# Patient Record
Sex: Female | Born: 1935 | Race: White | Hispanic: No | Marital: Married | Smoking: Never smoker
Health system: Southern US, Community
[De-identification: ages and names within clinical notes are randomized; demographics above are authoritative.]

## PROBLEM LIST (undated history)

## (undated) DIAGNOSIS — I10 Essential (primary) hypertension: Secondary | ICD-10-CM

## (undated) DIAGNOSIS — I2510 Atherosclerotic heart disease of native coronary artery without angina pectoris: Secondary | ICD-10-CM

## (undated) DIAGNOSIS — F419 Anxiety disorder, unspecified: Secondary | ICD-10-CM

## (undated) DIAGNOSIS — E079 Disorder of thyroid, unspecified: Secondary | ICD-10-CM

## (undated) DIAGNOSIS — I4891 Unspecified atrial fibrillation: Secondary | ICD-10-CM

## (undated) HISTORY — PX: PACEMAKER INSERTION: SHX728

---

## 2008-01-26 ENCOUNTER — Emergency Department (HOSPITAL_BASED_OUTPATIENT_CLINIC_OR_DEPARTMENT_OTHER): Admission: EM | Admit: 2008-01-26 | Discharge: 2008-01-26 | Payer: Self-pay | Admitting: Emergency Medicine

## 2008-02-03 ENCOUNTER — Emergency Department (HOSPITAL_BASED_OUTPATIENT_CLINIC_OR_DEPARTMENT_OTHER): Admission: EM | Admit: 2008-02-03 | Discharge: 2008-02-03 | Payer: Self-pay | Admitting: Emergency Medicine

## 2013-05-21 ENCOUNTER — Encounter (HOSPITAL_BASED_OUTPATIENT_CLINIC_OR_DEPARTMENT_OTHER): Payer: Self-pay | Admitting: Emergency Medicine

## 2013-05-21 ENCOUNTER — Emergency Department (HOSPITAL_BASED_OUTPATIENT_CLINIC_OR_DEPARTMENT_OTHER)
Admission: EM | Admit: 2013-05-21 | Discharge: 2013-05-21 | Disposition: A | Discharge status: Home / Self Care | Payer: Medicare Other | Attending: Emergency Medicine | Admitting: Emergency Medicine

## 2013-05-21 ENCOUNTER — Emergency Department (HOSPITAL_BASED_OUTPATIENT_CLINIC_OR_DEPARTMENT_OTHER): Payer: Medicare Other

## 2013-05-21 DIAGNOSIS — IMO0001 Reserved for inherently not codable concepts without codable children: Secondary | ICD-10-CM | POA: Insufficient documentation

## 2013-05-21 DIAGNOSIS — Y93K1 Activity, walking an animal: Secondary | ICD-10-CM | POA: Insufficient documentation

## 2013-05-21 DIAGNOSIS — I2510 Atherosclerotic heart disease of native coronary artery without angina pectoris: Secondary | ICD-10-CM | POA: Insufficient documentation

## 2013-05-21 DIAGNOSIS — Z950 Presence of cardiac pacemaker: Secondary | ICD-10-CM | POA: Insufficient documentation

## 2013-05-21 DIAGNOSIS — I10 Essential (primary) hypertension: Secondary | ICD-10-CM | POA: Insufficient documentation

## 2013-05-21 DIAGNOSIS — W010XXA Fall on same level from slipping, tripping and stumbling without subsequent striking against object, initial encounter: Secondary | ICD-10-CM | POA: Insufficient documentation

## 2013-05-21 DIAGNOSIS — I4891 Unspecified atrial fibrillation: Secondary | ICD-10-CM | POA: Insufficient documentation

## 2013-05-21 DIAGNOSIS — S82141A Displaced bicondylar fracture of right tibia, initial encounter for closed fracture: Secondary | ICD-10-CM

## 2013-05-21 DIAGNOSIS — F411 Generalized anxiety disorder: Secondary | ICD-10-CM | POA: Insufficient documentation

## 2013-05-21 DIAGNOSIS — S82109A Unspecified fracture of upper end of unspecified tibia, initial encounter for closed fracture: Secondary | ICD-10-CM | POA: Insufficient documentation

## 2013-05-21 DIAGNOSIS — E079 Disorder of thyroid, unspecified: Secondary | ICD-10-CM | POA: Insufficient documentation

## 2013-05-21 DIAGNOSIS — Y929 Unspecified place or not applicable: Secondary | ICD-10-CM | POA: Insufficient documentation

## 2013-05-21 HISTORY — DX: Disorder of thyroid, unspecified: E07.9

## 2013-05-21 HISTORY — DX: Atherosclerotic heart disease of native coronary artery without angina pectoris: I25.10

## 2013-05-21 HISTORY — DX: Essential (primary) hypertension: I10

## 2013-05-21 HISTORY — DX: Anxiety disorder, unspecified: F41.9

## 2013-05-21 HISTORY — DX: Unspecified atrial fibrillation: I48.91

## 2013-05-21 LAB — PROTIME-INR
INR: 2.5 — AB (ref 0.00–1.49)
PROTHROMBIN TIME: 26.2 s — AB (ref 11.6–15.2)

## 2013-05-21 MED ORDER — HYDROCODONE-ACETAMINOPHEN 5-325 MG PO TABS: 2.0000 | ORAL_TABLET | ORAL | Status: AC | PRN

## 2013-05-21 NOTE — ED Provider Notes (Signed)
CSN: [id number]     Arrival date & time 05/21/13  1110 History   First MD Initiated Contact with Patient 05/21/13 1133     Chief Complaint  Patient presents with  . Fall  . Knee Pain     (Consider location/radiation/quality/duration/timing/severity/associated sxs/prior Treatment) HPI Comments: Patient complains of right knee pain after falling 2 days ago. She was walking her dogs and stumbled and in the mud landing on her right knee. She denies hitting her head or losing consciousness. She is unable to put weight on the right leg. She denies any weakness, numbness or tingling. She denies any chest pain or shortness of breath. She denies any back pain. She is on Coumadin for atrial fibrillation. She denies any dizziness, lightheadedness or syncope.  The history is provided by the patient.    Past Medical History  Diagnosis Date  . Hypertension   . Coronary artery disease   . Thyroid disease   . Atrial fibrillation   . Anxiety    Past Surgical History  Procedure Laterality Date  . Pacemaker insertion     No family history on file. History  Substance Use Topics  . Smoking status: Never Smoker   . Smokeless tobacco: Not on file  . Alcohol Use: No   OB History   Grav Para Term Preterm Abortions TAB SAB Ect Mult Living                 Review of Systems  Constitutional: Negative for fever, activity change and appetite change.  HENT: Negative for congestion and rhinorrhea.   Respiratory: Negative for cough, chest tightness and shortness of breath.   Cardiovascular: Negative for chest pain.  Gastrointestinal: Negative for nausea, vomiting and abdominal pain.  Genitourinary: Negative for dysuria and hematuria.  Musculoskeletal: Positive for arthralgias and myalgias. Negative for back pain.  Skin: Negative for rash.  Neurological: Negative for dizziness, weakness and headaches.  A complete 10 system review of systems was obtained and all systems are negative except as noted in  the HPI and PMH.      Allergies  Review of patient's allergies indicates no known allergies.  Home Medications   Current Outpatient Rx  Name  Route  Sig  Dispense  Refill  . AMIODARONE HCL PO   Oral   Take by mouth.         . BUSPIRONE HCL PO   Oral   Take by mouth.         . diazepam (VALIUM) 2 MG tablet   Oral   Take 2 mg by mouth at bedtime as needed for anxiety.         Marland Kitchen HYDROcodone-acetaminophen (NORCO/VICODIN) 5-325 MG per tablet   Oral   Take 2 tablets by mouth every 4 (four) hours as needed.   10 tablet   0   . Levothyroxine Sodium (LEVOTHROID PO)   Oral   Take by mouth.         Marland Kitchen UNKNOWN TO PATIENT      Blood pressure medication, does not know name         . WARFARIN SODIUM PO   Oral   Take by mouth.          BP 109/60  Pulse 62  Temp(Src) 98.6 F (37 C) (Oral)  Resp 20  Ht 5\' 3"  (1.6 m)  Wt 140 lb (63.504 kg)  BMI 24.81 kg/m2  SpO2 98% Physical Exam  Constitutional: She is oriented to person, place, and  time. She appears well-developed and well-nourished. No distress.  HENT:  Head: Normocephalic and atraumatic.  Mouth/Throat: Oropharynx is clear and moist. No oropharyngeal exudate.  Eyes: Conjunctivae and EOM are normal. Pupils are equal, round, and reactive to light.  Neck: Normal range of motion. Neck supple.  No C spine tenderness  Cardiovascular: Normal rate, regular rhythm and normal heart sounds.   No murmur heard. Pulmonary/Chest: Effort normal and breath sounds normal. No respiratory distress.  Abdominal: Soft. There is no tenderness. There is no rebound and no guarding.  Musculoskeletal: Normal range of motion. She exhibits no edema and no tenderness.  TTP R anterior knee. No effusion or deformity, flexion and extension intact. +2 DP and PT pulse. No pain with ROM R hip.  No T or L spine tenderness  Neurological: She is alert and oriented to person, place, and time. No cranial nerve deficit. She exhibits normal muscle  tone. Coordination normal.  Skin: Skin is warm.    ED Course  Procedures (including critical care time) Labs Review Labs Reviewed  PROTIME-INR - Abnormal; Notable for the following:    Prothrombin Time 26.2 (*)    INR 2.50 (*)    All other components within normal limits   Imaging Review Dg Hip Complete Right  05/21/2013   CLINICAL DATA:  Fall  EXAM: RIGHT HIP - COMPLETE 2+ VIEW  COMPARISON:  None.  FINDINGS: Osteopenia.  No acute fracture.  No dislocation.  IUD noted.  IMPRESSION: No acute bony pathology.   Electronically Signed   By: Maryclare BeanArt  Hoss M.D.   On: 05/21/2013 12:24   Ct Head Wo Contrast  05/21/2013   CLINICAL DATA:  Slipped and fell in mud.  Headache.  EXAM: CT HEAD WITHOUT CONTRAST  TECHNIQUE: Contiguous axial images were obtained from the base of the skull through the vertex without contrast.  COMPARISON:  01/26/2008  FINDINGS: There is chronic encephalomalacia in the medial and inferior left cerebellar hemisphere. No evidence for acute hemorrhage, mass lesion, midline shift, hydrocephalus or new infarct. Chronic calcification in the left basal ganglia. No acute bone abnormality. The visualized sinuses and mastoid air cells are clear.  IMPRESSION: No acute intracranial abnormality.  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   Electronically Signed   By: Richarda OverlieAdam  Henn M.D.   On: 05/21/2013 12:31   Dg Knee Complete 4 Views Right  05/21/2013   CLINICAL DATA:  Right knee pain after fall.  EXAM: RIGHT KNEE - COMPLETE 4+ VIEW  COMPARISON:  None.  FINDINGS: No joint effusion is noted. Mildly depressed fracture involving the medial tibial plateau is noted. No joint space narrowing is noted.  IMPRESSION: Mildly depressed medial tibial plateau fracture.   Electronically Signed   By: Roque LiasJames  Green M.D.   On: 05/21/2013 12:21     EKG Interpretation None      MDM   Final diagnoses:  Tibial plateau fracture, right   Right knee pain after falling  in the mud 2 days ago. Denies hitting head or losing consciousness.  Right knee x-ray shows a medial tibial plateau fracture. Hip x-ray negative. CT head negative. INR 2.5 Intact distal pulses.   Fracture discussed with Dr. Charlann Boxerlin of orthopedics. He recommends a knee wrap, nonweightbearing. Followup in 2 weeks. Patient has walker at home as well as wheechair.  Denies any pain with ROM of Hip.    Date: 05/21/2013  Rate: 65  Rhythm: normal sinus rhythm paced  QRS Axis: left  Intervals: normal  ST/T Wave abnormalities: normal  Conduction Disutrbances:none  Narrative Interpretation:   Old EKG Reviewed: none available    Glynn Octave, MD 05/21/13 1505

## 2013-05-21 NOTE — Discharge Instructions (Signed)
Tibial Fracture, Adult Do not bear weight on R leg.  Follow up with Dr. Charlann Boxerlin or orthopedic doctor of your choice in 2 weeks. Return to the ED if you develop new or worsening symptoms. You have a fracture (break in bone) of your tibia. This is the large "shin" bone in your lower leg. These fractures are easily diagnosed with x-rays. TREATMENT  You have a simple fracture which usually will heal without disability. It can be treated with simple immobilization. This means the bone can be held with a cast or splint in a favorable position until your caregiver feels it is stable (healed well enough). Then you can begin range of motion exercises to keep your knee and ankle limber (moving well). HOME CARE INSTRUCTIONS   Apply ice to the injury for 15-20 minutes, 03-04 times per day while awake, for 2 days. Put the ice in a plastic bag and place a thin towel between the bag of ice and your cast.  If you have a plaster or fiberglass cast:  Do not try to scratch the skin under the cast using sharp or pointed objects.  Check the skin around the cast every day. You may put lotion on any red or sore areas.  Keep your cast dry and clean.  If you have a plaster splint:  Wear the splint as directed.  You may loosen the elastic around the splint if your toes become numb, tingle, or turn cold or blue.  Do not put pressure on any part of your cast or splint until it is fully hardened.  Your cast or splint can be protected during bathing with a plastic bag. Do not lower the cast or splint into water.  Use crutches as directed.  Only take over-the-counter or prescription medicines for pain, discomfort, or fever as directed by your caregiver.  See your caregiver as directed. It is very important to keep all follow-up referrals and appointments in order to avoid any long-term problems with your leg and ankle including chronic pain, inability to move the ankle normally, failure of the fracture to heal and  permanent disability. SEEK IMMEDIATE MEDICAL CARE IF:   Pain is becoming worse rather than better, or if pain is uncontrolled with medications.  You have increased swelling, pain, or redness in the foot.  You begin to lose feeling in your foot or toes.  You develop a cold or blue foot or toes on the injured side.  You develop severe pain in your injured leg, especially if it is increased with movement of your toes. Document Released: 12/01/2000 Document Revised: 05/31/2011 Document Reviewed: 06/25/2008 [facility] Patient Information 2014 [place], [place].

## 2013-05-21 NOTE — ED Notes (Signed)
Slipped in mud and fell 2 days ago. Hit her right knee on unknown object. Painful.

## 2015-06-11 IMAGING — CR DG HIP COMPLETE 2+V*R*
3 series · 3 of 3 positions shown · non-contrast
Comparison: None.

CLINICAL DATA: Fall

EXAM:
RIGHT HIP - COMPLETE 2+ VIEW

[t pelvis a.p.]
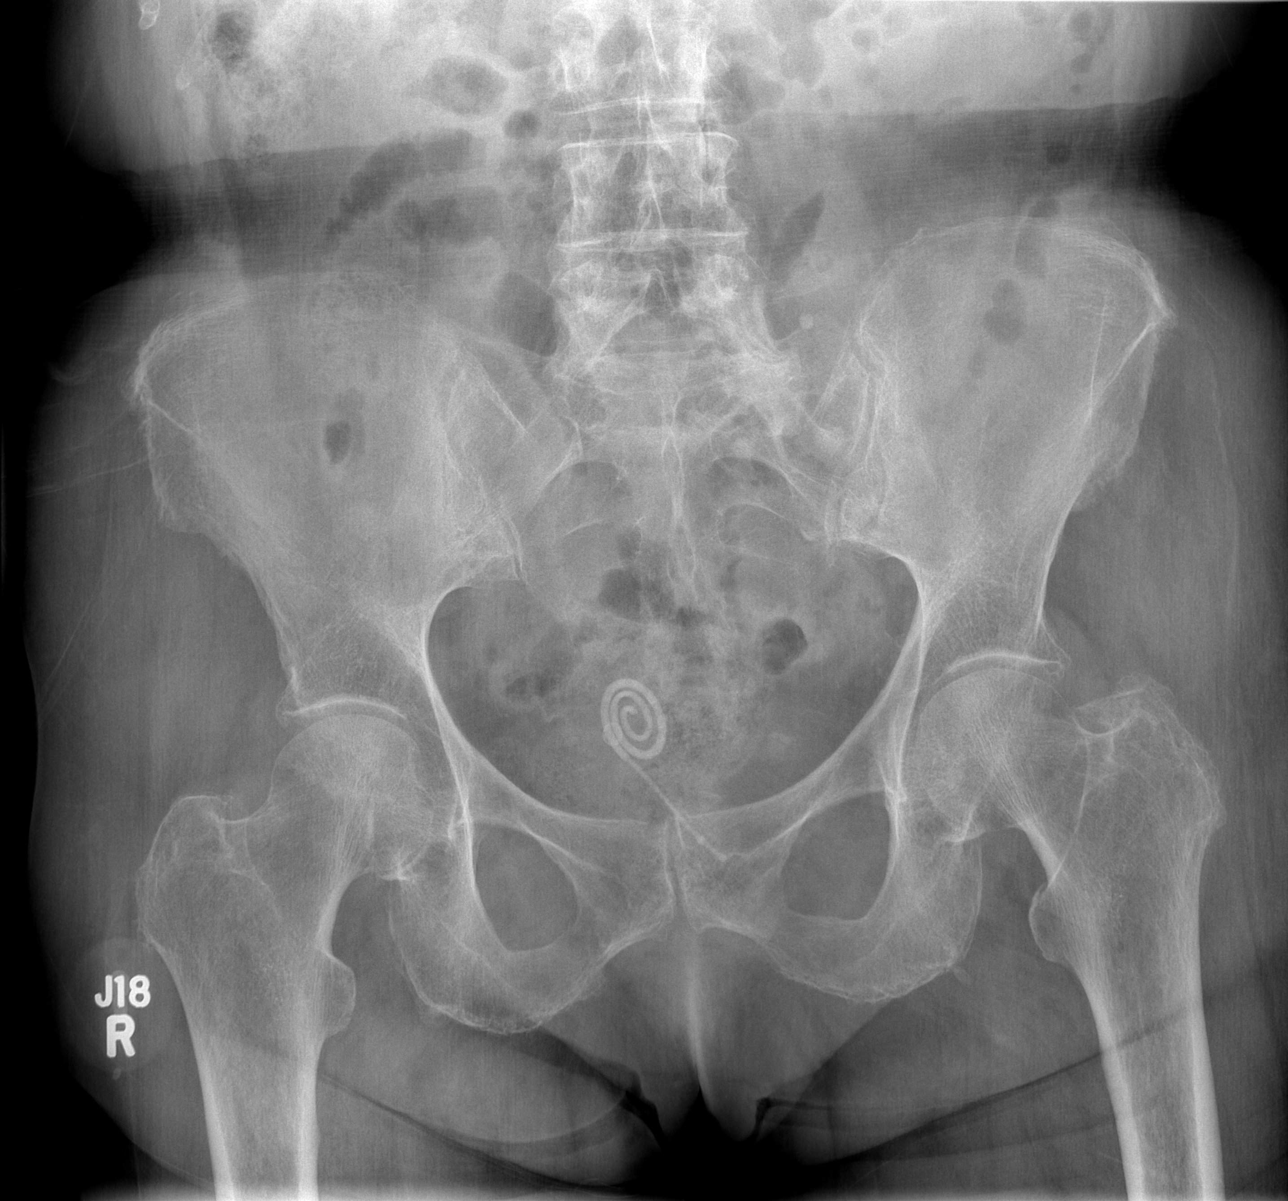

[t hip ap right]
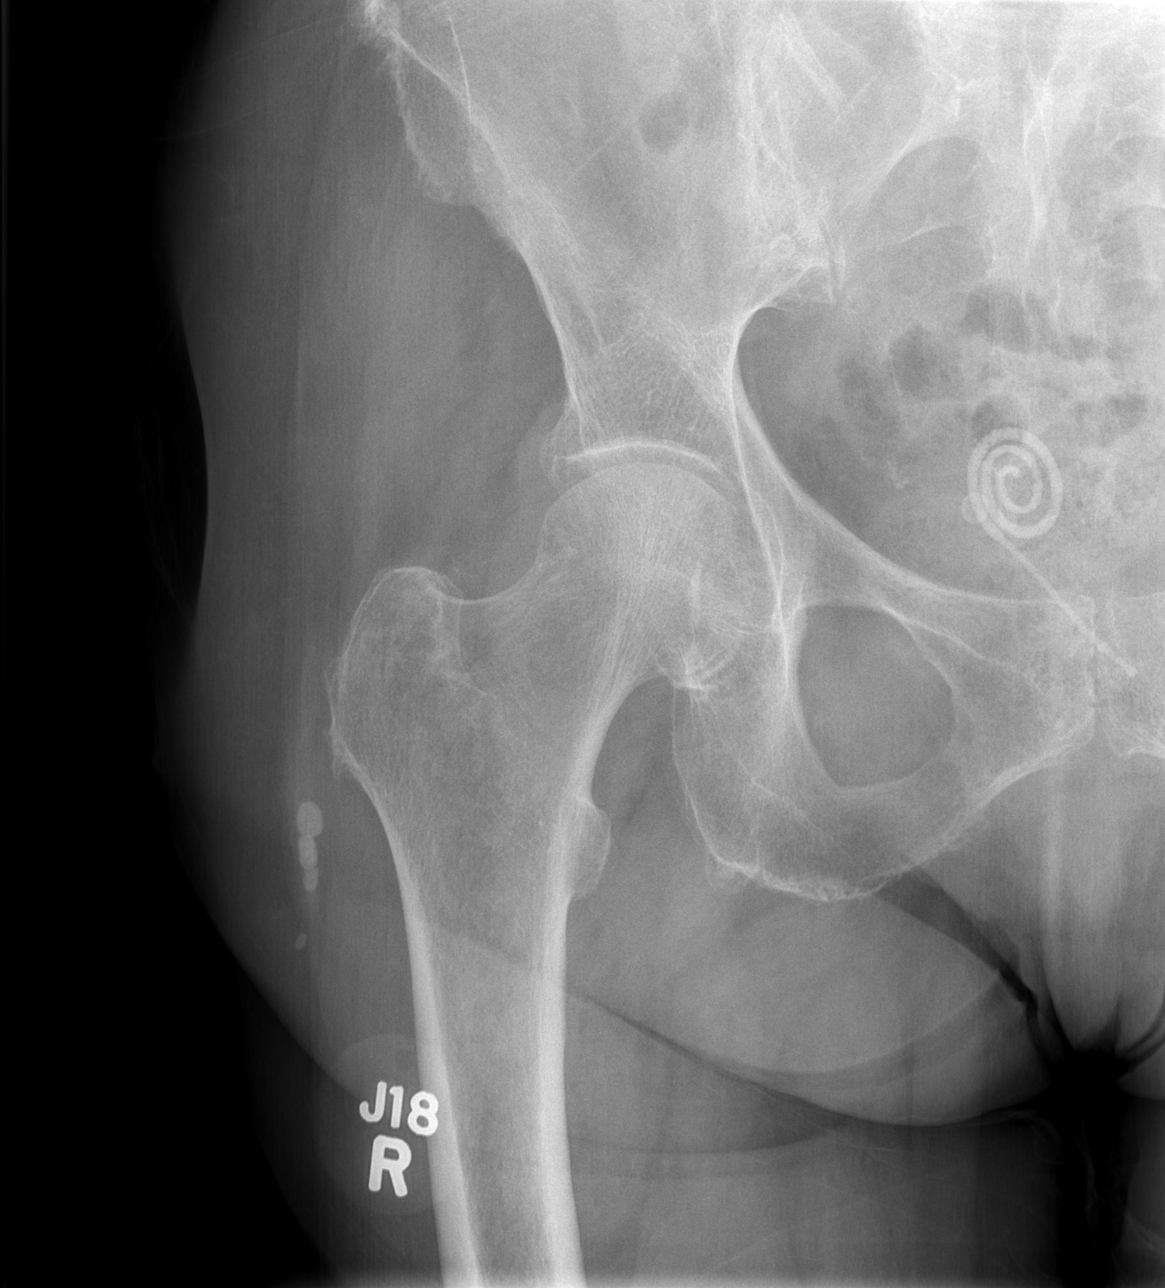

[t hip frog leg right]
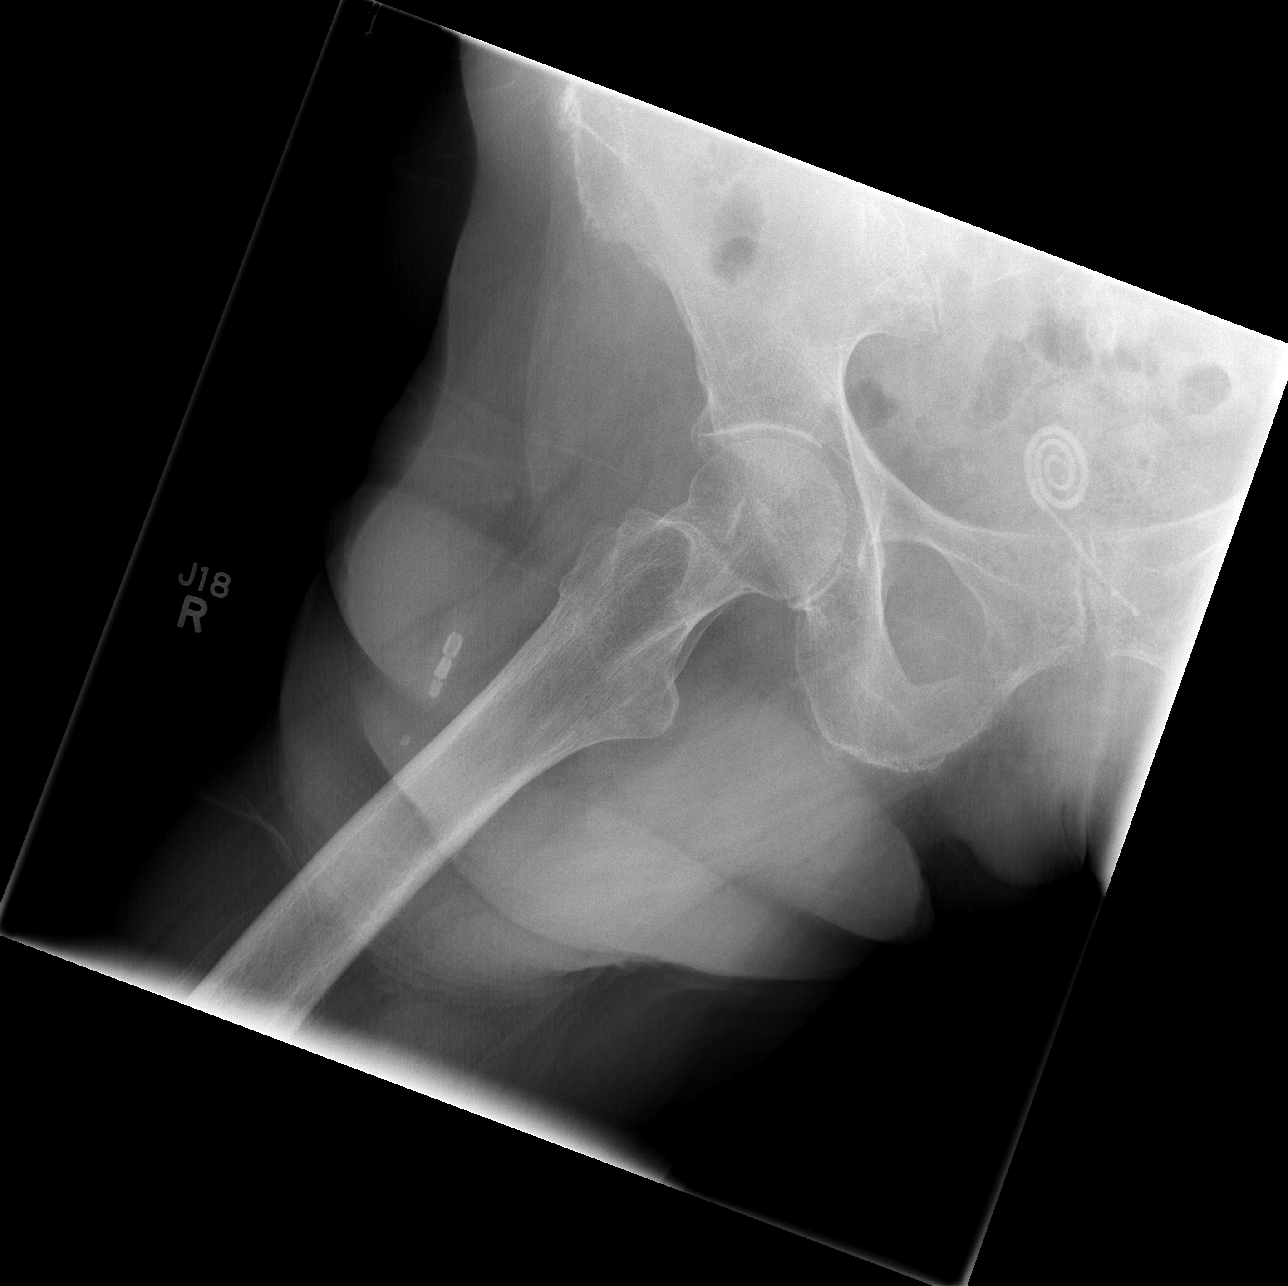

[3 of 3 positions shown; findings below may reference images not displayed]

FINDINGS: Osteopenia.  No acute fracture.  No dislocation.  IUD noted.
IMPRESSION: No acute bony pathology.

## 2017-10-20 DEATH — deceased
